# Patient Record
Sex: Male | Born: 1940
Health system: Southern US, Community
[De-identification: ages and names within clinical notes are randomized; demographics above are authoritative.]

## PROBLEM LIST (undated history)

## (undated) DIAGNOSIS — J9500 Unspecified tracheostomy complication: Secondary | ICD-10-CM

## (undated) DIAGNOSIS — I639 Cerebral infarction, unspecified: Secondary | ICD-10-CM

## (undated) DIAGNOSIS — Z952 Presence of prosthetic heart valve: Secondary | ICD-10-CM

## (undated) DIAGNOSIS — I259 Chronic ischemic heart disease, unspecified: Secondary | ICD-10-CM

## (undated) DIAGNOSIS — R001 Bradycardia, unspecified: Secondary | ICD-10-CM

## (undated) DIAGNOSIS — I495 Sick sinus syndrome: Secondary | ICD-10-CM

## (undated) DIAGNOSIS — I442 Atrioventricular block, complete: Secondary | ICD-10-CM

## (undated) HISTORY — PX: AORTIC VALVE REPLACEMENT (AVR)/CORONARY ARTERY BYPASS GRAFTING (CABG): SHX5725

---

## 2017-09-13 ENCOUNTER — Inpatient Hospital Stay
Admission: EM | Admit: 2017-09-13 | Discharge: 2017-10-14 | Disposition: A | Discharge status: Skilled Nursing Facility | Payer: Self-pay | Attending: Internal Medicine | Admitting: Internal Medicine

## 2017-09-13 DIAGNOSIS — Z4659 Encounter for fitting and adjustment of other gastrointestinal appliance and device: Secondary | ICD-10-CM

## 2017-09-13 DIAGNOSIS — R001 Bradycardia, unspecified: Secondary | ICD-10-CM

## 2017-09-13 DIAGNOSIS — I442 Atrioventricular block, complete: Secondary | ICD-10-CM

## 2017-09-13 DIAGNOSIS — Z431 Encounter for attention to gastrostomy: Secondary | ICD-10-CM

## 2017-09-13 DIAGNOSIS — I639 Cerebral infarction, unspecified: Secondary | ICD-10-CM

## 2017-09-13 DIAGNOSIS — J9690 Respiratory failure, unspecified, unspecified whether with hypoxia or hypercapnia: Secondary | ICD-10-CM

## 2017-09-13 DIAGNOSIS — I495 Sick sinus syndrome: Secondary | ICD-10-CM

## 2017-09-13 DIAGNOSIS — Z931 Gastrostomy status: Secondary | ICD-10-CM

## 2017-09-13 DIAGNOSIS — J9500 Unspecified tracheostomy complication: Secondary | ICD-10-CM

## 2017-09-13 DIAGNOSIS — I259 Chronic ischemic heart disease, unspecified: Secondary | ICD-10-CM

## 2017-09-13 DIAGNOSIS — Z952 Presence of prosthetic heart valve: Secondary | ICD-10-CM

## 2017-09-13 DIAGNOSIS — D499 Neoplasm of unspecified behavior of unspecified site: Secondary | ICD-10-CM

## 2017-09-13 HISTORY — DX: Sick sinus syndrome: I49.5

## 2017-09-13 HISTORY — DX: Atrioventricular block, complete: I44.2

## 2017-09-13 HISTORY — DX: Cerebral infarction, unspecified: I63.9

## 2017-09-13 HISTORY — DX: Unspecified tracheostomy complication: J95.00

## 2017-09-13 HISTORY — DX: Bradycardia, unspecified: R00.1

## 2017-09-13 HISTORY — DX: Presence of prosthetic heart valve: Z95.2

## 2017-09-13 HISTORY — DX: Chronic ischemic heart disease, unspecified: I25.9

## 2017-09-13 LAB — BLOOD GAS, ARTERIAL
ACID-BASE EXCESS: 7.2 mmol/L — AB (ref 0.0–2.0)
Bicarbonate: 30.8 mmol/L — ABNORMAL HIGH (ref 20.0–28.0)
FIO2: 40
MECHVT: 390 mL
O2 Saturation: 97.7 %
PEEP/CPAP: 5 cmH2O
Patient temperature: 97.6
RATE: 12 resp/min
pCO2 arterial: 39.3 mmHg (ref 32.0–48.0)
pH, Arterial: 7.503 — ABNORMAL HIGH (ref 7.350–7.450)
pO2, Arterial: 92.9 mmHg (ref 83.0–108.0)

## 2017-09-14 ENCOUNTER — Other Ambulatory Visit (HOSPITAL_COMMUNITY): Payer: Self-pay

## 2017-09-14 LAB — CBC WITH DIFFERENTIAL/PLATELET
BASOS ABS: 0 10*3/uL (ref 0.0–0.1)
BASOS PCT: 0 %
Eosinophils Absolute: 0.1 10*3/uL (ref 0.0–0.7)
Eosinophils Relative: 1 %
HEMATOCRIT: 26.7 % — AB (ref 39.0–52.0)
HEMOGLOBIN: 8.5 g/dL — AB (ref 13.0–17.0)
LYMPHS PCT: 10 %
Lymphs Abs: 1 10*3/uL (ref 0.7–4.0)
MCH: 30.9 pg (ref 26.0–34.0)
MCHC: 31.8 g/dL (ref 30.0–36.0)
MCV: 97.1 fL (ref 78.0–100.0)
Monocytes Absolute: 0.6 10*3/uL (ref 0.1–1.0)
Monocytes Relative: 6 %
NEUTROS ABS: 7.8 10*3/uL — AB (ref 1.7–7.7)
NEUTROS PCT: 83 %
Platelets: 328 10*3/uL (ref 150–400)
RBC: 2.75 MIL/uL — AB (ref 4.22–5.81)
RDW: 14.4 % (ref 11.5–15.5)
WBC: 9.5 10*3/uL (ref 4.0–10.5)

## 2017-09-14 LAB — COMPREHENSIVE METABOLIC PANEL
ALBUMIN: 1.8 g/dL — AB (ref 3.5–5.0)
ALT: 38 U/L (ref 17–63)
ANION GAP: 12 (ref 5–15)
AST: 45 U/L — ABNORMAL HIGH (ref 15–41)
Alkaline Phosphatase: 98 U/L (ref 38–126)
BILIRUBIN TOTAL: 0.4 mg/dL (ref 0.3–1.2)
BUN: 28 mg/dL — ABNORMAL HIGH (ref 6–20)
CO2: 25 mmol/L (ref 22–32)
Calcium: 7.4 mg/dL — ABNORMAL LOW (ref 8.9–10.3)
Chloride: 98 mmol/L — ABNORMAL LOW (ref 101–111)
Creatinine, Ser: 0.97 mg/dL (ref 0.61–1.24)
GLUCOSE: 178 mg/dL — AB (ref 65–99)
POTASSIUM: 3.7 mmol/L (ref 3.5–5.1)
Sodium: 135 mmol/L (ref 135–145)
TOTAL PROTEIN: 5.4 g/dL — AB (ref 6.5–8.1)

## 2017-09-14 LAB — PROTIME-INR
INR: 1.04
PROTHROMBIN TIME: 13.5 s (ref 11.4–15.2)

## 2017-09-14 LAB — PHOSPHORUS: PHOSPHORUS: 3.5 mg/dL (ref 2.5–4.6)

## 2017-09-14 LAB — APTT: APTT: 38 s — AB (ref 24–36)

## 2017-09-14 LAB — MAGNESIUM: Magnesium: 2.8 mg/dL — ABNORMAL HIGH (ref 1.7–2.4)

## 2017-09-15 ENCOUNTER — Other Ambulatory Visit (HOSPITAL_COMMUNITY): Payer: Self-pay

## 2017-09-16 LAB — MAGNESIUM: Magnesium: 2.4 mg/dL (ref 1.7–2.4)

## 2017-09-16 LAB — C DIFFICILE QUICK SCREEN W PCR REFLEX
C DIFFICLE (CDIFF) ANTIGEN: NEGATIVE
C Diff interpretation: NOT DETECTED
C Diff toxin: NEGATIVE

## 2017-09-16 LAB — BASIC METABOLIC PANEL
Anion gap: 7 (ref 5–15)
BUN: 20 mg/dL (ref 6–20)
CALCIUM: 7.7 mg/dL — AB (ref 8.9–10.3)
CHLORIDE: 106 mmol/L (ref 101–111)
CO2: 28 mmol/L (ref 22–32)
CREATININE: 0.85 mg/dL (ref 0.61–1.24)
GFR calc non Af Amer: >60 mL/min (ref >60)
GLUCOSE: 133 mg/dL — AB (ref 65–99)
Potassium: 3.4 mmol/L — ABNORMAL LOW (ref 3.5–5.1)
Sodium: 141 mmol/L (ref 135–145)

## 2017-09-16 LAB — PROTIME-INR
INR: 1.06
PROTHROMBIN TIME: 13.8 s (ref 11.4–15.2)

## 2017-09-16 LAB — VANCOMYCIN, TROUGH: Vancomycin Tr: 8 ug/mL — ABNORMAL LOW (ref 15–20)

## 2017-09-17 LAB — PROTIME-INR
INR: 1.13
Prothrombin Time: 14.4 seconds (ref 11.4–15.2)

## 2017-09-17 NOTE — Consult Note (Signed)
Chief Complaint: Patient was seen in consultation today for percutaneous gastric tube placement at the request of Dr Laren Everts  Supervising Physician: Markus Daft  Patient Status: [facility]  History of Present Illness: Antonio Frost is a 76 y.o. male   Hx CVA DM; CHF; CAD Hospitalized with Pneumonia Become hypoxic- suffered cardiac arrest Trach 11/15 Developed Upper extr DVT-- now on coumadin and bridging Lovenox INR 1.13 today Dysphagia Protein calorie malnutrition Need for long term care  Request for Percutaneous gastric tube placement Dr Barbie Banner has reviewed imaging and approves procedure On Plavix actively Must be off 5 days to proceed safely   No past medical history on file.   Allergies: Patient has no allergy information on record.  Medications: Prior to Admission medications   Not on File     No family history on file.  Social History   Socioeconomic History  . Marital status: Not on file    Spouse name: Not on file  . Number of children: Not on file  . Years of education: Not on file  . Highest education level: Not on file  Social Needs  . Financial resource strain: Not on file  . Food insecurity - worry: Not on file  . Food insecurity - inability: Not on file  . Transportation needs - medical: Not on file  . Transportation needs - non-medical: Not on file  Occupational History  . Not on file  Tobacco Use  . Smoking status: Not on file  Substance and Sexual Activity  . Alcohol use: Not on file  . Drug use: Not on file  . Sexual activity: Not on file  Other Topics Concern  . Not on file  Social History Narrative  . Not on file    Review of Systems: A 12 point ROS discussed and pertinent positives are indicated in the HPI above.  All other systems are negative.  Review of Systems  Constitutional: Positive for activity change, appetite change and fatigue. Negative for fever.  Respiratory:       Trach    Vital Signs: There were no  vitals taken for this visit.  Physical Exam  Cardiovascular: Normal rate and regular rhythm.  Pulmonary/Chest: Breath sounds normal. He has no wheezes.  Abdominal: Soft. Bowel sounds are normal.  Skin: Skin is warm and dry.  Psychiatric:  Wife at bedside Discussed G tube placement with her Await decision on Plavix and holding for 5 days  Nursing note and vitals reviewed.   Imaging: Ct Abdomen Wo Contrast  Result Date: 09/16/2017 CLINICAL DATA:  G tube evaluation.  Dysphagia. EXAM: CT ABDOMEN WITHOUT CONTRAST TECHNIQUE: Multidetector CT imaging of the abdomen was performed following the standard protocol without IV contrast. COMPARISON:  None. FINDINGS: Lower chest: There is consolidation in both medial posterior lower lobes right greater than left. Peripheral calcific densities at the aortic valve. This may be related to postoperative change. Coronary artery calcifications are present. Hepatobiliary: Unremarkable Pancreas: Unremarkable Spleen: Unremarkable Adrenals/Urinary Tract: Tiny calculus in the anterior upper pole of the right kidney. Mild cortical thinning bilaterally. Adrenal glands are normal. Stomach/Bowel: There are dilated small bowel loops with air-fluid levels. The lower abdomen and pelvis were not imaged on this study. A definitive transition zone cannot be identified. The stomach is positioned between the transverse colon and left lobe of the liver. A feeding tube is in place. The tip is just beyond the duodenal bulb and in the first portion of the duodenum. Vascular/Lymphatic: No abnormal retroperitoneal  adenopathy. Advanced atherosclerotic calcification of the aorta and iliac arteries. Other: No free-fluid. Musculoskeletal: No vertebral compression. IMPRESSION: There is favorable anatomy for gastrostomy tube placement. Bilateral lower lobe consolidation right greater than left. Right nephrolithiasis. Dilated small bowel loops with air-fluid levels are nonspecific. This probably  represents ileus. Developing small bowel obstruction is not excluded. Electronically Signed   By: Marybelle Killings M.D.   On: 09/16/2017 08:33   Dg Chest Port 1 View  Result Date: 09/14/2017 CLINICAL DATA:  Shortness of breath. EXAM: PORTABLE CHEST 1 VIEW COMPARISON:  None. FINDINGS: A feeding tube at least reaches the stomach. Tracheostomy in place. Streaky opacities over the diaphragm. No Kerley lines, air bronchogram, effusion, or pneumothorax. Normal heart size. Status post CABG. IMPRESSION: Mild atelectasis at the bases. Electronically Signed   By: Monte Fantasia M.D.   On: 09/14/2017 09:12   Dg Abd Portable 1v  Result Date: 09/14/2017 CLINICAL DATA:  76 year old male status post NG tube placement. EXAM: PORTABLE ABDOMEN - 1 VIEW COMPARISON:  None. FINDINGS: An enteric tube is partially visualized extending into the left upper abdomen with tip to the right of the L1-L2 disc space likely in the distal stomach. Air is noted within the colon. No small bowel dilatation. Small amount of air under the left hemidiaphragm may be within the stomach. Pneumoperitoneum is not entirely excluded. Correlation with history of recent surgery recommended. Median sternotomy wires noted. There is degenerative changes of the spine. No acute osseous pathology. IMPRESSION: Enteric tube with tip in the distal stomach. Electronically Signed   By: Anner Crete M.D.   On: 09/14/2017 00:48    Labs:  CBC: Recent Labs    09/14/17 0700  WBC 9.5  HGB 8.5*  HCT 26.7*  PLT 328    COAGS: Recent Labs    09/14/17 0700 09/16/17 0412 09/17/17 0528  INR 1.04 1.06 1.13  APTT 38*  --   --     BMP: Recent Labs    09/14/17 0700 09/16/17 1118  NA 135 141  K 3.7 3.4*  CL 98* 106  CO2 25 28  GLUCOSE 178* 133*  BUN 28* 20  CALCIUM 7.4* 7.7*  CREATININE 0.97 0.85  GFRNONAA >60 >60  GFRAA >60 >60    LIVER FUNCTION TESTS: Recent Labs    09/14/17 0700  BILITOT 0.4  AST 45*  ALT 38  ALKPHOS 98  PROT  5.4*  ALBUMIN 1.8*    TUMOR MARKERS: No results for input(s): AFPTM, CEA, CA199, CHROMGRNA in the last 8760 hours.  Assessment and Plan:  CVA Dysphagia Protein calorie malnutrition Need for long term care On Plavix daily (must be off 5 days to safely perform this procedure) Await decision from MD Pt also now off coumadin - INR 1.13  Discussed with wife procedure benefits and risks including but not limited to; Infection; bleeding; organ damage; damage to surrounding structures She will wait to discuss with family. Await decision on Plavix hold.   Thank you for this interesting consult.  I greatly enjoyed meeting Shonte B Ivanov and look forward to participating in their care.  A copy of this report was sent to the requesting provider on this date.  Electronically Signed: Lavonia Drafts, PA-C 09/17/2017, 10:30 AM   I spent a total of 40 Minutes    in face to face in clinical consultation, greater than 50% of which was counseling/coordinating care for perc G tube placement

## 2017-09-18 LAB — PROTIME-INR
INR: 1.14
Prothrombin Time: 14.5 seconds (ref 11.4–15.2)

## 2017-09-19 LAB — PROTIME-INR
INR: 1.13
Prothrombin Time: 14.4 seconds (ref 11.4–15.2)

## 2017-09-20 LAB — CBC
HCT: 27.9 % — ABNORMAL LOW (ref 39.0–52.0)
HEMOGLOBIN: 8.7 g/dL — AB (ref 13.0–17.0)
MCH: 30.7 pg (ref 26.0–34.0)
MCHC: 31.2 g/dL (ref 30.0–36.0)
MCV: 98.6 fL (ref 78.0–100.0)
Platelets: 268 10*3/uL (ref 150–400)
RBC: 2.83 MIL/uL — ABNORMAL LOW (ref 4.22–5.81)
RDW: 15.1 % (ref 11.5–15.5)
WBC: 5.2 10*3/uL (ref 4.0–10.5)

## 2017-09-20 LAB — PROTIME-INR
INR: 1.25
PROTHROMBIN TIME: 15.6 s — AB (ref 11.4–15.2)

## 2017-09-21 ENCOUNTER — Other Ambulatory Visit (HOSPITAL_COMMUNITY): Payer: Self-pay

## 2017-09-21 LAB — PROTIME-INR
INR: 1.33
PROTHROMBIN TIME: 16.3 s — AB (ref 11.4–15.2)

## 2017-09-21 NOTE — Progress Notes (Signed)
Patient has been bridging with lovenox.  Appears coumadin has been administered for the past 2 days. INR remains <1.5. Will recheck INR tomorrow.  As long as remains <1.5 will work towards procedure as scheduled.   Hold lovenox and coumadin. Orders given to hold blood thinners and TFs.   Brynda Greathouse, MS RD PA-C 12:27 PM

## 2017-09-22 ENCOUNTER — Encounter (HOSPITAL_COMMUNITY): Payer: Self-pay | Admitting: Interventional Radiology

## 2017-09-22 ENCOUNTER — Other Ambulatory Visit (HOSPITAL_COMMUNITY): Payer: Self-pay

## 2017-09-22 HISTORY — PX: IR GASTROSTOMY TUBE MOD SED: IMG625

## 2017-09-22 LAB — PROTIME-INR
INR: 1.28
PROTHROMBIN TIME: 15.9 s — AB (ref 11.4–15.2)

## 2017-09-22 MED ORDER — MIDAZOLAM HCL 2 MG/2ML IJ SOLN
INTRAMUSCULAR | Status: AC | PRN
  Administered 2017-09-22: 1 mg via INTRAVENOUS
  Administered 2017-09-22: 0.5 mg via INTRAVENOUS

## 2017-09-22 MED ORDER — LIDOCAINE-EPINEPHRINE (PF) 1 %-1:200000 IJ SOLN
30.0000 mL | INTRAMUSCULAR | Status: AC
  Filled 2017-09-22: qty 30

## 2017-09-22 MED ORDER — MIDAZOLAM HCL 2 MG/2ML IJ SOLN
INTRAMUSCULAR | Status: AC
  Filled 2017-09-22: qty 2

## 2017-09-22 MED ORDER — LIDOCAINE HCL 1 % IJ SOLN
INTRAMUSCULAR | Status: AC
  Filled 2017-09-22: qty 20

## 2017-09-22 MED ORDER — CEFAZOLIN SODIUM-DEXTROSE 2-4 GM/100ML-% IV SOLN
INTRAVENOUS | Status: AC
  Filled 2017-09-22: qty 100

## 2017-09-22 MED ORDER — GLUCAGON HCL RDNA (DIAGNOSTIC) 1 MG IJ SOLR
INTRAMUSCULAR | Status: AC | PRN
  Administered 2017-09-22: 1 mg via INTRAVENOUS

## 2017-09-22 MED ORDER — FENTANYL CITRATE (PF) 100 MCG/2ML IJ SOLN
INTRAMUSCULAR | Status: AC | PRN
  Administered 2017-09-22: 25 ug via INTRAVENOUS
  Administered 2017-09-22: 50 ug via INTRAVENOUS

## 2017-09-22 MED ORDER — FENTANYL CITRATE (PF) 100 MCG/2ML IJ SOLN
INTRAMUSCULAR | Status: AC
  Filled 2017-09-22: qty 2

## 2017-09-22 MED ORDER — LIDOCAINE-EPINEPHRINE (PF) 2 %-1:200000 IJ SOLN
INTRAMUSCULAR | Status: AC | PRN
  Administered 2017-09-22: 10 mL

## 2017-09-22 MED ORDER — CEFAZOLIN SODIUM-DEXTROSE 2-4 GM/100ML-% IV SOLN
2.0000 g | Freq: Once | INTRAVENOUS | Status: AC
  Administered 2017-09-22: 2 g via INTRAVENOUS

## 2017-09-22 MED ORDER — GLUCAGON HCL RDNA (DIAGNOSTIC) 1 MG IJ SOLR
INTRAMUSCULAR | Status: AC
  Filled 2017-09-22: qty 1

## 2017-09-22 MED ORDER — IOPAMIDOL (ISOVUE-300) INJECTION 61%
INTRAVENOUS | Status: AC
  Administered 2017-09-22: 20 mL
  Filled 2017-09-22: qty 50

## 2017-09-22 NOTE — Procedures (Signed)
Pre procedure Dx: Dysphagia Post Procedure Dx: Same  Successful fluoroscopic guided insertion of gastrostomy tube.   The gastrostomy tube may be used immediately for medications.   Tube feeds may be initiated in 24 hours as per the primary team.    EBL: Minimal  Complications: None immediate  Jay Jonel Sick, MD Pager #: [phone]    

## 2017-09-24 LAB — PROTIME-INR
INR: 1.34
PROTHROMBIN TIME: 16.4 s — AB (ref 11.4–15.2)

## 2017-09-25 LAB — CBC
HEMATOCRIT: 25.8 % — AB (ref 39.0–52.0)
Hemoglobin: 8.5 g/dL — ABNORMAL LOW (ref 13.0–17.0)
MCH: 31.5 pg (ref 26.0–34.0)
MCHC: 32.9 g/dL (ref 30.0–36.0)
MCV: 95.6 fL (ref 78.0–100.0)
PLATELETS: 119 10*3/uL — AB (ref 150–400)
RBC: 2.7 MIL/uL — AB (ref 4.22–5.81)
RDW: 16 % — AB (ref 11.5–15.5)
WBC: 4 10*3/uL (ref 4.0–10.5)

## 2017-09-25 LAB — BASIC METABOLIC PANEL
Anion gap: 8 (ref 5–15)
BUN: 15 mg/dL (ref 6–20)
CHLORIDE: 102 mmol/L (ref 101–111)
CO2: 24 mmol/L (ref 22–32)
Calcium: 8.3 mg/dL — ABNORMAL LOW (ref 8.9–10.3)
Creatinine, Ser: 0.71 mg/dL (ref 0.61–1.24)
Glucose, Bld: 104 mg/dL — ABNORMAL HIGH (ref 65–99)
POTASSIUM: 5.5 mmol/L — AB (ref 3.5–5.1)
SODIUM: 134 mmol/L — AB (ref 135–145)

## 2017-09-26 LAB — POTASSIUM: POTASSIUM: 4.9 mmol/L (ref 3.5–5.1)

## 2017-09-27 LAB — BASIC METABOLIC PANEL
ANION GAP: 8 (ref 5–15)
BUN: 14 mg/dL (ref 6–20)
CALCIUM: 8.2 mg/dL — AB (ref 8.9–10.3)
CO2: 26 mmol/L (ref 22–32)
Chloride: 103 mmol/L (ref 101–111)
Creatinine, Ser: 0.71 mg/dL (ref 0.61–1.24)
Glucose, Bld: 132 mg/dL — ABNORMAL HIGH (ref 65–99)
POTASSIUM: 3.6 mmol/L (ref 3.5–5.1)
Sodium: 137 mmol/L (ref 135–145)

## 2017-09-29 ENCOUNTER — Encounter: Payer: Self-pay | Admitting: Internal Medicine

## 2017-09-29 DIAGNOSIS — I495 Sick sinus syndrome: Secondary | ICD-10-CM

## 2017-09-29 DIAGNOSIS — J9509 Other tracheostomy complication: Secondary | ICD-10-CM

## 2017-09-29 DIAGNOSIS — Z952 Presence of prosthetic heart valve: Secondary | ICD-10-CM

## 2017-09-29 DIAGNOSIS — I442 Atrioventricular block, complete: Secondary | ICD-10-CM

## 2017-09-29 DIAGNOSIS — I639 Cerebral infarction, unspecified: Secondary | ICD-10-CM

## 2017-09-29 DIAGNOSIS — J9500 Unspecified tracheostomy complication: Secondary | ICD-10-CM

## 2017-09-29 DIAGNOSIS — I259 Chronic ischemic heart disease, unspecified: Secondary | ICD-10-CM

## 2017-09-29 DIAGNOSIS — I499 Cardiac arrhythmia, unspecified: Secondary | ICD-10-CM

## 2017-09-29 DIAGNOSIS — R001 Bradycardia, unspecified: Secondary | ICD-10-CM

## 2017-09-29 HISTORY — DX: Chronic ischemic heart disease, unspecified: I25.9

## 2017-09-29 HISTORY — DX: Sick sinus syndrome: I49.5

## 2017-09-29 HISTORY — DX: Cerebral infarction, unspecified: I63.9

## 2017-09-29 HISTORY — DX: Unspecified tracheostomy complication: J95.00

## 2017-09-29 HISTORY — DX: Bradycardia, unspecified: R00.1

## 2017-09-29 HISTORY — DX: Presence of prosthetic heart valve: Z95.2

## 2017-09-29 HISTORY — DX: Atrioventricular block, complete: I44.2

## 2017-09-29 LAB — CBC
HEMATOCRIT: 33.9 % — AB (ref 39.0–52.0)
HEMOGLOBIN: 10.6 g/dL — AB (ref 13.0–17.0)
MCH: 31.1 pg (ref 26.0–34.0)
MCHC: 31.3 g/dL (ref 30.0–36.0)
MCV: 99.4 fL (ref 78.0–100.0)
Platelets: 288 10*3/uL (ref 150–400)
RBC: 3.41 MIL/uL — ABNORMAL LOW (ref 4.22–5.81)
RDW: 16.6 % — AB (ref 11.5–15.5)
WBC: 4.6 10*3/uL (ref 4.0–10.5)

## 2017-09-29 LAB — BASIC METABOLIC PANEL
ANION GAP: 8 (ref 5–15)
BUN: 12 mg/dL (ref 6–20)
CALCIUM: 8.6 mg/dL — AB (ref 8.9–10.3)
CO2: 28 mmol/L (ref 22–32)
Chloride: 102 mmol/L (ref 101–111)
Creatinine, Ser: 0.72 mg/dL (ref 0.61–1.24)
GFR calc Af Amer: >60 mL/min (ref >60)
GLUCOSE: 108 mg/dL — AB (ref 65–99)
POTASSIUM: 4.5 mmol/L (ref 3.5–5.1)
SODIUM: 138 mmol/L (ref 135–145)

## 2017-09-29 LAB — TSH: TSH: 2.972 u[IU]/mL (ref 0.350–4.500)

## 2017-09-29 LAB — MAGNESIUM: MAGNESIUM: 1.9 mg/dL (ref 1.7–2.4)

## 2017-09-29 NOTE — Consult Note (Signed)
Cardiology Consultation:   Patient ID: Antonio Frost; [id number]; [birth year]   Admit date: 09/13/2017 Date of Consult: 09/29/2017  Primary Care Provider: No primary care provider on file. Primary Cardiologist: No primary care provider on file.  Dr. Metta Clines [facility] Primary Electrophysiologist: None   Patient Profile:   Antonio Frost is a 76 y.o. male with a hx of CAD (remote MI/PCI), DM, CHF, HTN, hypothyroidism, VHD with bioprosthetic AVR, and VDRF with trach now who is being seen today for the evaluation of bradycardic episodes at the request of Dr. Owens Shark.  History of Present Illness:   Mr. Narine was initially admitted to [facility] 08/23/17 with respiratory failure, CAP, became hypoxic and intubated, self-extubated but required re-intubation, eventually extubated though a few days later became hypoxic again had arrest requiring CPR and was re-intubated ultimately required intubation, and unable to wean requiring trach.  During his stay he had seizure and found with new CVA as well.  Recurrent fever with VAP, UTI, was also found with b/l UE DVT, on coumadin w/lovenox until therapeutic.  [facility] and pulmonary care  He has no  antecedent history of syncope  Meds reviewed, no rate limiting medicines   Past Medical History:  Diagnosis Date  . Complete heart block ([place]) 09/29/2017  . H/O aortic valve replacement 09/29/2017  . Ischemic heart disease 09/29/2017  . Sinus node dysfunction ([place]) 09/29/2017  . Stroke ([place]) 09/29/2017  . Tracheostomy complication ([place]) 41/06/3789  . Vagal autonomic bradycardia 09/29/2017        Inpatient Medications: Scheduled Meds: reviewed but not available for import   Continuous Infusions:  PRN Meds:   Allergies:   Allergies not on file  Social History:   Social History   Socioeconomic History  . Marital status: Not on file    Spouse name: Not on file  . Number of children: Not on file    . Years of education: Not on file  . Highest education level: Not on file  Social Needs  . Financial resource strain: Not on file  . Food insecurity - worry: Not on file  . Food insecurity - inability: Not on file  . Transportation needs - medical: Not on file  . Transportation needs - non-medical: Not on file  Occupational History  . Not on file  Tobacco Use  . Smoking status: Not on file  Substance and Sexual Activity  . Alcohol use: Not on file  . Drug use: Not on file  . Sexual activity: Not on file  Other Topics Concern  . Not on file  Social History Narrative  . Not on file    Family History:   Fam hx neg for conduction system disease  ROS:  Please see the history of present illness.  ROS  All other ROS reviewed and negative.     Physical Exam/Data:   Vitals:   09/22/17 1429 09/22/17 1441 09/22/17 1446 09/22/17 1450  BP: (!) 172/67 (!) 160/64 (!) 185/70 (!) 160/70  Pulse: 61 62 65 70  Resp: (!) 24 16 15    SpO2: 100% 99% 97% 100%     Well developed and nourished with trach in place and nonverbal HENT normal Neck supple   Carotids brisk and full without bruits Clear anteriorly  regular rate and rhythm, no murmurs or gallops Abd-soft with active BS without hepatomegaly No Clubbing cyanosis bilateral.  Bandages  and boots of his lower extremities Skin-warm and dry A  Grossly normal sensory and motor function agitated activity  EKG:  The EKG was personally reviewed and demonstrates:   No available EKG Telemetry:  Telemetry was personally reviewed and demonstrates:   Episodic bradycardia with the most profound episode in 0200 hrs. demonstrating simultaneous PP slowing  with the development of AV block  Relevant CV Studies:  [facility] record reports an echo done 04/25/15 LVEF 50-60%  Laboratory Data:  Chemistry Recent Labs  Lab 09/25/17 0546 09/26/17 1441 09/27/17 0701 09/29/17 0954  NA 134*  --  137 138  K 5.5* 4.9 3.6 4.5  CL 102  --  103 102  CO2  24  --  26 28  GLUCOSE 104*  --  132* 108*  BUN 15  --  14 12  CREATININE 0.71  --  0.71 0.72  CALCIUM 8.3*  --  8.2* 8.6*  GFRNONAA >60  --  >60 >60  GFRAA >60  --  >60 >60  ANIONGAP 8  --  8 8    No results for input(s): PROT, ALBUMIN, AST, ALT, ALKPHOS, BILITOT in the last 168 hours. Hematology Recent Labs  Lab 09/25/17 0546 09/29/17 0954  WBC 4.0 4.6  RBC 2.70* 3.41*  HGB 8.5* 10.6*  HCT 25.8* 33.9*  MCV 95.6 99.4  MCH 31.5 31.1  MCHC 32.9 31.3  RDW 16.0* 16.6*  PLT 119* 288   Cardiac EnzymesNo results for input(s): TROPONINI in the last 168 hours. No results for input(s): TROPIPOC in the last 168 hours.  BNPNo results for input(s): BNP, PROBNP in the last 168 hours.  DDimer No results for input(s): DDIMER in the last 168 hours.  Radiology/Studies:  No results found.  Assessment and Plan:   Active Problems:   H/O aortic valve replacement   Ischemic heart disease   Stroke [facility])   Tracheostomy complication (HCC)   Complete heart block (HCC)   Sinus node dysfunction (HCC) DVT on anticoagulation Vagal mediated bradycardia    Patient's telemetry demonstrates simultaneous sinus node dysfunction and complete heart block.  This highlights the presence of an extrinsic cardiac cause affecting the sinus node and the AV node.  Almost certainly this is hyper vagotonia and in this circumstance I would expect that the likely trigger is either the tracheostomy or some kind of viscus distention related to constipation or urinary obstruction.  This does not require cardiac intervention.  Please call if we can be of further assistance     For questions or updates, please contact [facility] Please consult [email or web address] for contact info under Cardiology/STEMI.   Signed, Virl Axe, MD  09/29/2017 6:15 PM

## 2017-09-30 ENCOUNTER — Other Ambulatory Visit (HOSPITAL_COMMUNITY): Payer: Self-pay

## 2017-09-30 LAB — PROTIME-INR
INR: 1.04
Prothrombin Time: 13.5 seconds (ref 11.4–15.2)

## 2017-10-01 LAB — PROTIME-INR
INR: 1.1
Prothrombin Time: 14.1 seconds (ref 11.4–15.2)

## 2017-10-02 LAB — PROTIME-INR
INR: 1.1
PROTHROMBIN TIME: 14.1 s (ref 11.4–15.2)

## 2017-10-03 LAB — PROTIME-INR
INR: 1.11
Prothrombin Time: 14.2 seconds (ref 11.4–15.2)

## 2017-10-04 LAB — PROTIME-INR
INR: 1.13
PROTHROMBIN TIME: 14.4 s (ref 11.4–15.2)

## 2017-10-05 LAB — PROTIME-INR
INR: 1.29
Prothrombin Time: 16 seconds — ABNORMAL HIGH (ref 11.4–15.2)

## 2017-10-06 LAB — PROTIME-INR
INR: 1.28
PROTHROMBIN TIME: 15.8 s — AB (ref 11.4–15.2)

## 2017-10-07 LAB — PROTIME-INR
INR: 1.3
PROTHROMBIN TIME: 16.1 s — AB (ref 11.4–15.2)

## 2017-10-08 LAB — PROTIME-INR
INR: 1.44
Prothrombin Time: 17.4 seconds — ABNORMAL HIGH (ref 11.4–15.2)

## 2017-10-09 LAB — CBC
HEMATOCRIT: 37.1 % — AB (ref 39.0–52.0)
HEMOGLOBIN: 11.4 g/dL — AB (ref 13.0–17.0)
MCH: 31 pg (ref 26.0–34.0)
MCHC: 30.7 g/dL (ref 30.0–36.0)
MCV: 100.8 fL — AB (ref 78.0–100.0)
Platelets: 303 10*3/uL (ref 150–400)
RBC: 3.68 MIL/uL — AB (ref 4.22–5.81)
RDW: 16.7 % — ABNORMAL HIGH (ref 11.5–15.5)
WBC: 5.2 10*3/uL (ref 4.0–10.5)

## 2017-10-09 LAB — BASIC METABOLIC PANEL
ANION GAP: 11 (ref 5–15)
BUN: 19 mg/dL (ref 6–20)
CHLORIDE: 100 mmol/L — AB (ref 101–111)
CO2: 27 mmol/L (ref 22–32)
Calcium: 8.7 mg/dL — ABNORMAL LOW (ref 8.9–10.3)
Creatinine, Ser: 0.64 mg/dL (ref 0.61–1.24)
GFR calc Af Amer: >60 mL/min (ref >60)
GFR calc non Af Amer: >60 mL/min (ref >60)
GLUCOSE: 192 mg/dL — AB (ref 65–99)
POTASSIUM: 4.7 mmol/L (ref 3.5–5.1)
Sodium: 138 mmol/L (ref 135–145)

## 2017-10-09 LAB — PROTIME-INR
INR: 1.45
Prothrombin Time: 17.5 seconds — ABNORMAL HIGH (ref 11.4–15.2)

## 2017-10-10 LAB — PROTIME-INR
INR: 1.29
PROTHROMBIN TIME: 15.9 s — AB (ref 11.4–15.2)

## 2017-10-11 LAB — PROTIME-INR
INR: 1.41
PROTHROMBIN TIME: 17.2 s — AB (ref 11.4–15.2)

## 2017-10-12 LAB — PROTIME-INR
INR: 1.46
PROTHROMBIN TIME: 17.6 s — AB (ref 11.4–15.2)

## 2017-10-13 ENCOUNTER — Other Ambulatory Visit (HOSPITAL_COMMUNITY): Payer: Self-pay

## 2017-10-13 LAB — PROTIME-INR
INR: 1.76
Prothrombin Time: 20.4 seconds — ABNORMAL HIGH (ref 11.4–15.2)

## 2017-10-13 MED ORDER — IOPAMIDOL (ISOVUE-300) INJECTION 61%
50.0000 mL | Freq: Once | INTRAVENOUS | Status: AC | PRN
  Administered 2017-10-13: 50 mL

## 2017-10-13 MED ORDER — IOPAMIDOL (ISOVUE-300) INJECTION 61%
INTRAVENOUS | Status: AC
  Administered 2017-10-13: 50 mL
  Filled 2017-10-13: qty 50

## 2017-10-14 LAB — PROTIME-INR
INR: 1.94
PROTHROMBIN TIME: 22 s — AB (ref 11.4–15.2)

## 2017-10-26 DEATH — deceased

## 2019-10-17 IMAGING — XA IR PERC PLACEMENT GASTROSTOMY
7 series · 7 of 7 positions shown · non-contrast
Comparison: Abdominal CT - 09/15/2017

INDICATION: Dysphagia. Please perform percutaneous gastrostomy tube placement
for enteric nutrition supplementation.

EXAM:
PULL TROUGH GASTROSTOMY TUBE PLACEMENT

[Series 1: fl (-) angio · 1 of 1 slices shown (1 of 7)]
[im 1/1]
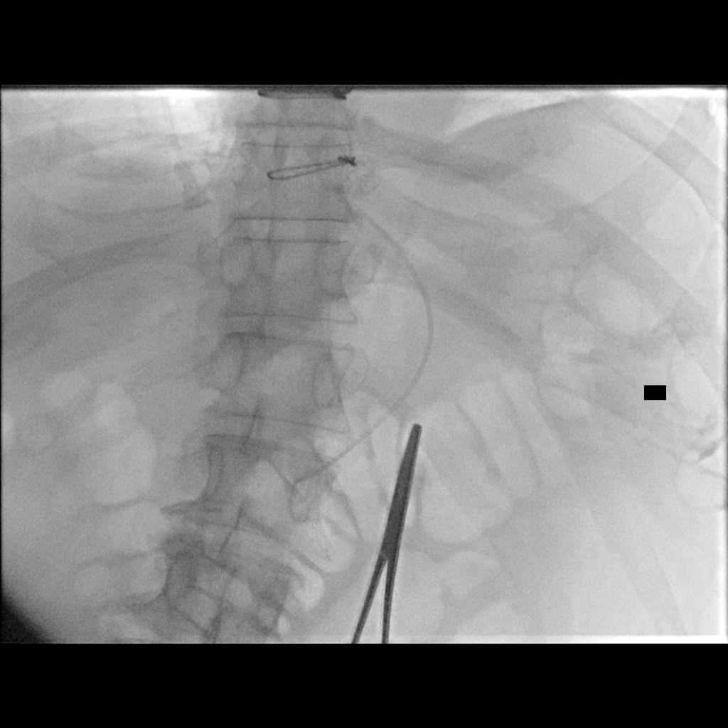

[Series 2: fl (-) angio · 1 of 1 slices shown (2 of 7)]
[im 1/1]
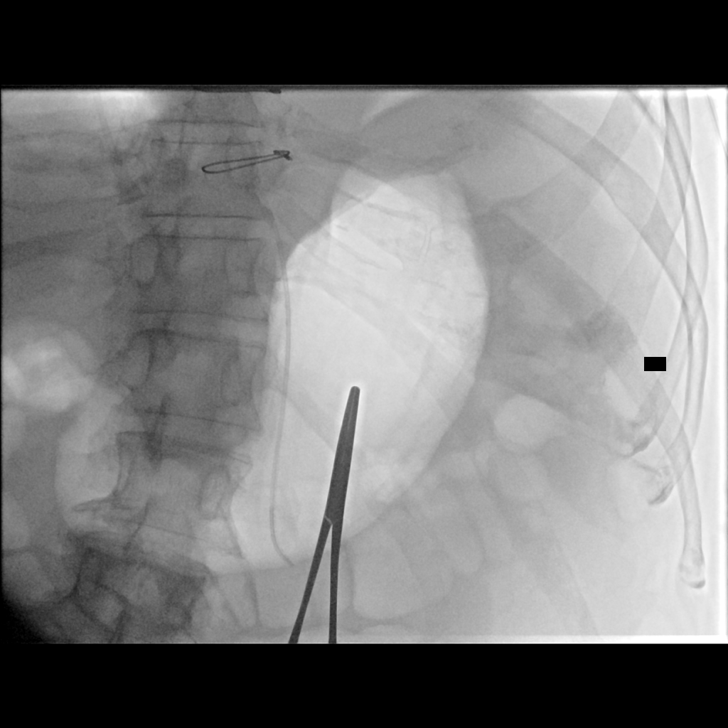

[Series 3: fl (-) angio · 1 of 1 slices shown (3 of 7)]
[im 1/1]
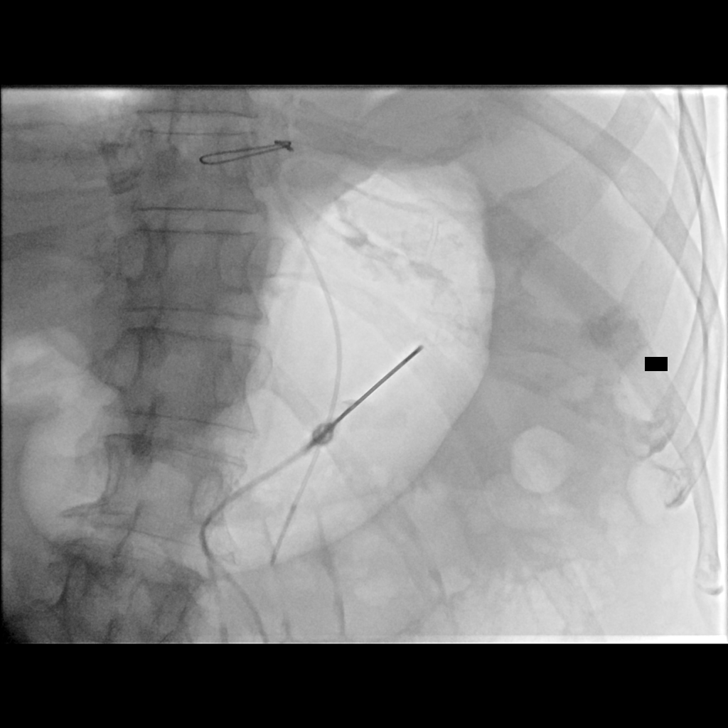

[Series 4: fl (-) angio · 1 of 1 slices shown (4 of 7)]
[im 1/1]
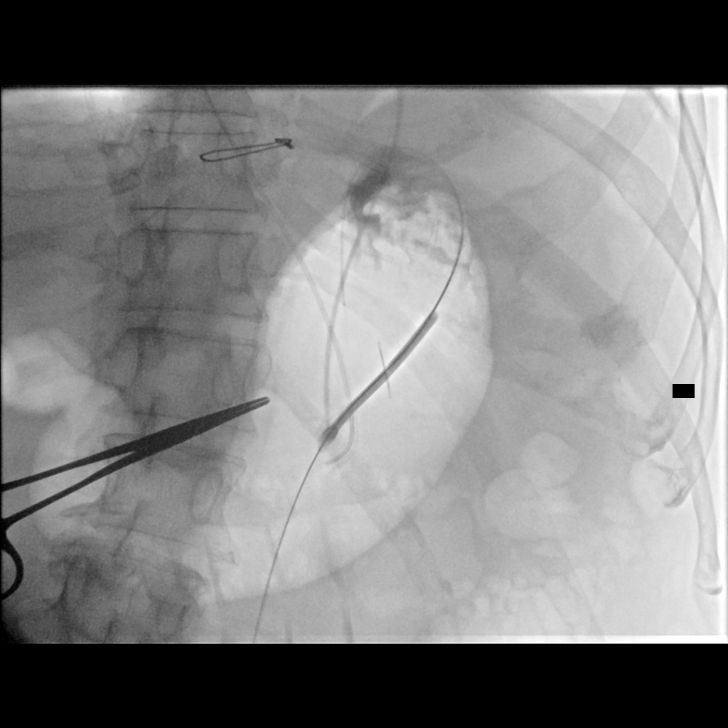

[Series 5: fl (-) angio · 1 of 1 slices shown (5 of 7)]
[im 1/1]
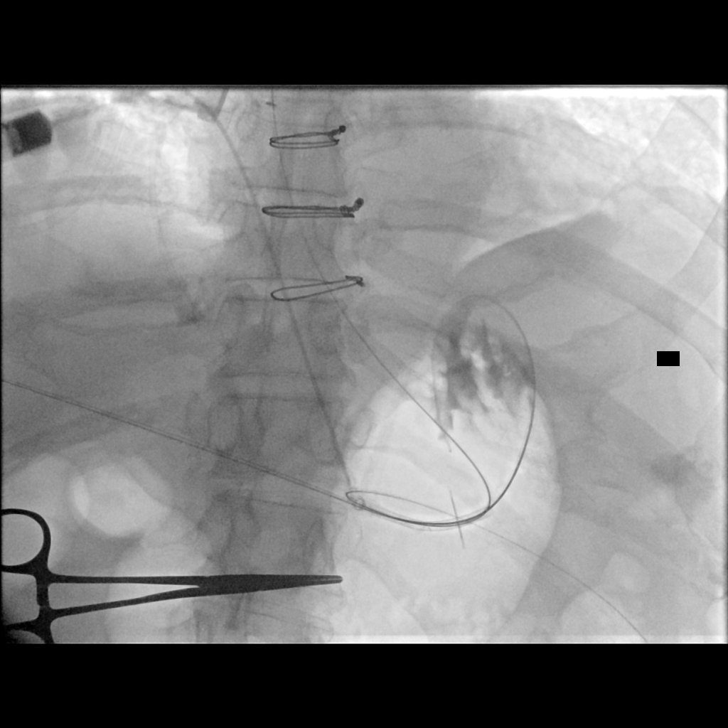

[Series 6: fl (-) angio · 1 of 1 slices shown (6 of 7)]
[im 1/1]
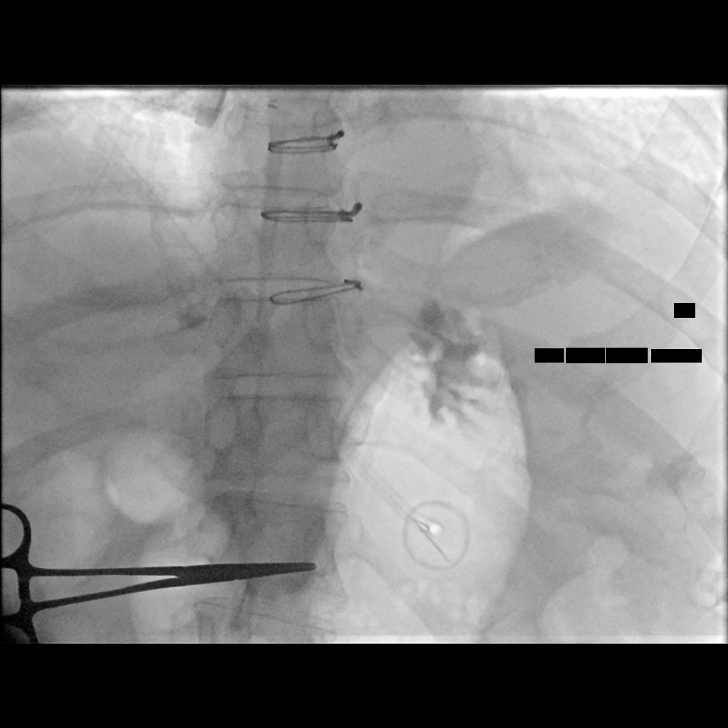

[Series 9: fl (-) angio · 1 of 1 slices shown (7 of 7)]
[im 1/1]
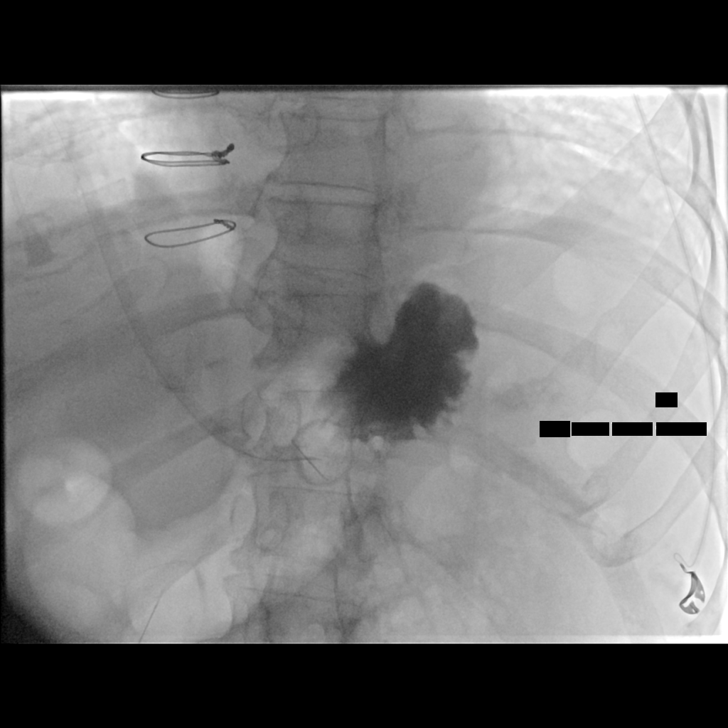

[7 of 7 positions shown; findings below may reference images not displayed]

MEDICATIONS:
Ancef 2 gm IV; Antibiotics were administered within 1 hour of the
procedure.

Glucagon 1 mg IV

CONTRAST:  20 mL of Isovue 300 administered into the gastric lumen.

ANESTHESIA/SEDATION:
Moderate (conscious) sedation was employed during this procedure. A
total of Versed 1.5 mg and Fentanyl 75 mcg was administered
intravenously.

Moderate Sedation Time: 10 minutes. The patient's level of
consciousness and vital signs were monitored continuously by
radiology nursing throughout the procedure under my direct
supervision.

FLUOROSCOPY TIME:  7 minutes 54 seconds (71 mGy)

COMPLICATIONS:
None immediate.

PROCEDURE:
Informed written consent was obtained from the patient's family
following explanation of the procedure, risks, benefits and
alternatives. A time out was performed prior to the initiation of
the procedure. Ultrasound scanning was performed to demarcate the
edge of the left lobe of the liver. Maximal barrier sterile
technique utilized including caps, mask, sterile gowns, sterile
gloves, large sterile drape, hand hygiene and Betadine prep.

The left upper quadrant was sterilely prepped and draped. An oral
gastric catheter was inserted into the stomach under fluoroscopy.
The existing nasogastric feeding tube was removed. The left costal
margin and air opacified transverse colon were identified and
avoided. Air was injected into the stomach for insufflation and
visualization under fluoroscopy. Under sterile conditions a 17 gauge
trocar needle was utilized to access the stomach percutaneously
beneath the left subcostal margin after the overlying soft tissues
were anesthetized with 1% Lidocaine with epinephrine. Needle
position was confirmed within the stomach with aspiration of air and
injection of small amount of contrast. A single T tack was deployed
for gastropexy. Over an Amplatz guide wire, a 9-French sheath was
inserted into the stomach. A snare device was utilized to capture
the oral gastric catheter. The snare device was pulled retrograde
from the stomach up the esophagus and out the oropharynx. The
20-French pull-through gastrostomy was connected to the snare device
and pulled antegrade through the oropharynx down the esophagus into
the stomach and then through the percutaneous tract external to the
patient. The gastrostomy was assembled externally. Contrast
injection confirms position in the stomach. Several spot
radiographic images were obtained in various obliquities for
documentation. The patient tolerated procedure well without
immediate post procedural complication.
FINDINGS: After successful fluoroscopic guided placement, the gastrostomy tube
is appropriately positioned with internal disc against the ventral
aspect of the gastric lumen.
IMPRESSION: Successful fluoroscopic insertion of a 20-French pull-through
gastrostomy tube.

The gastrostomy may be used immediately for medication
administration and in 24 hrs for the initiation of feeds.

## 2019-11-07 IMAGING — DX DG ABDOMEN 1V
1 series · 1 of 1 positions shown · non-contrast
Comparison: 09/22/2017 fluoroscopic images during gastrostomy tube
placement. 09/21/2017 abdominal radiograph.

CLINICAL DATA: Gastrostomy tube check.

EXAM:
ABDOMEN - 1 VIEW

[abdomen kub]
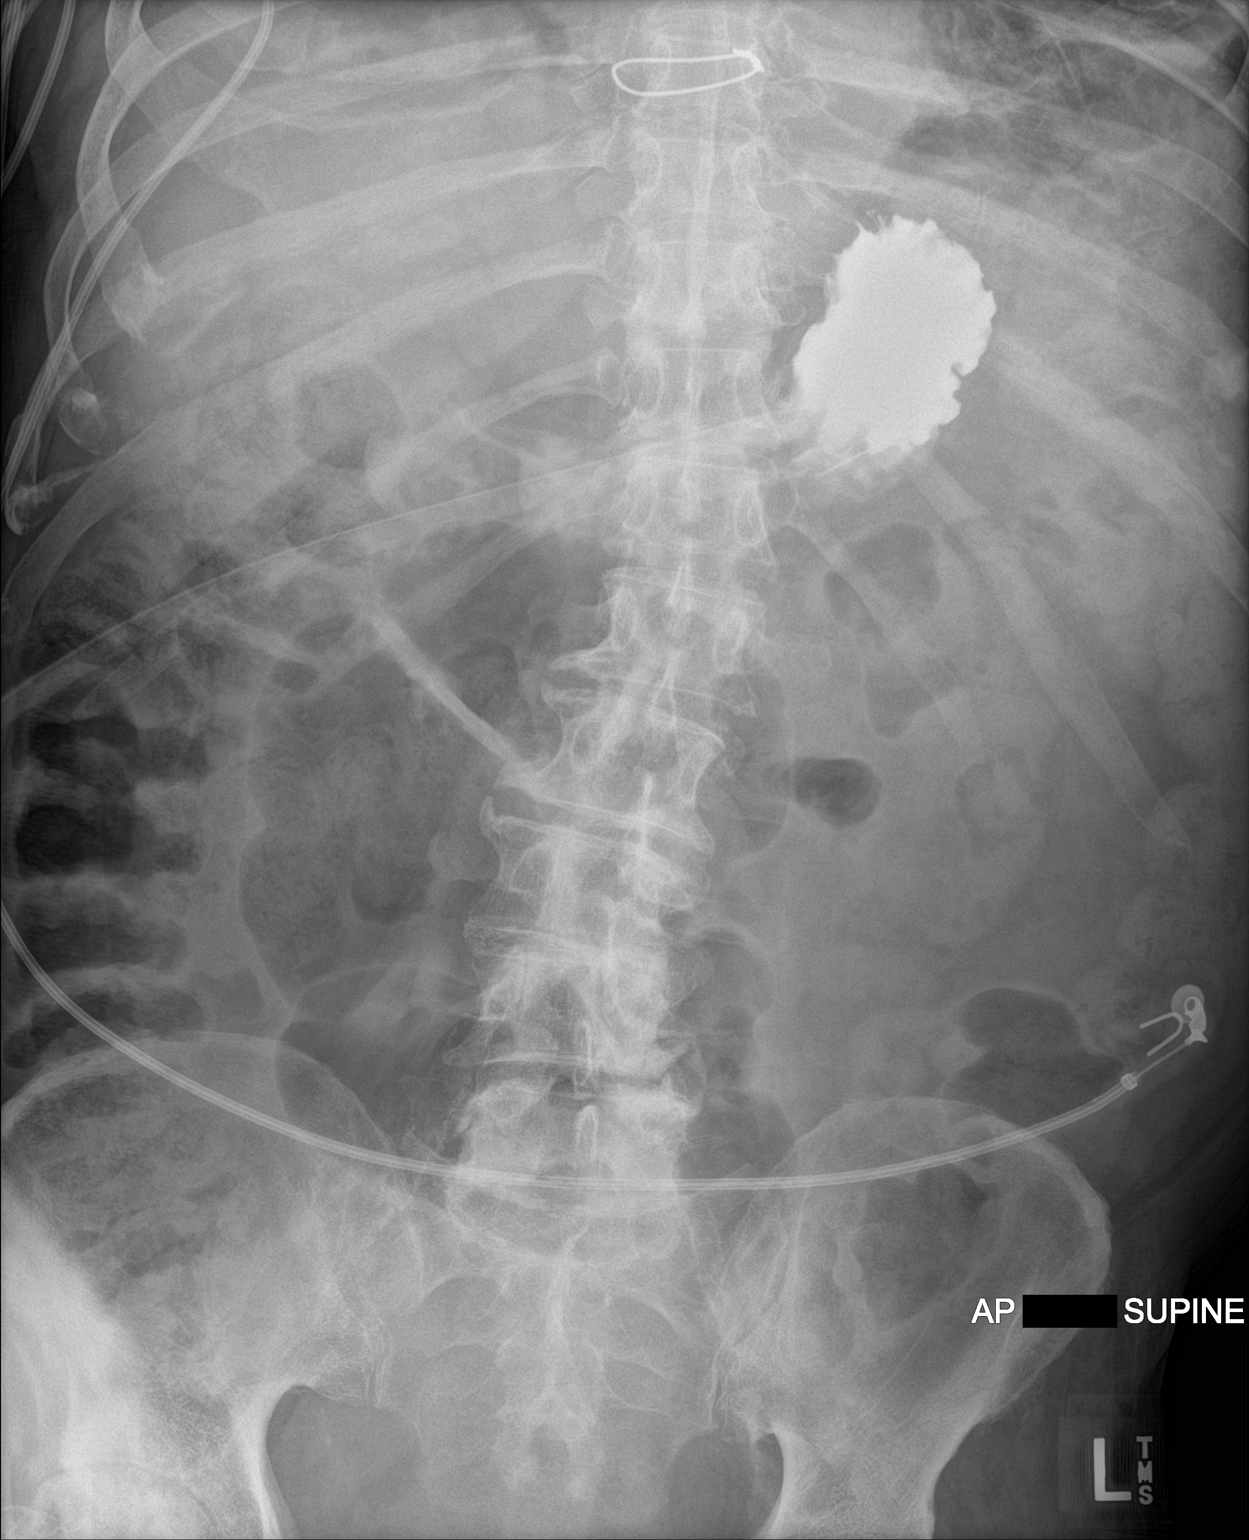

[1 of 1 positions shown; findings below may reference images not displayed]

FINDINGS: Injected contrast opacifies the stomach, consistent with
intraluminal positioning of the gastrostomy tube. A moderate amount
of stool is present in the colon. There is mild gaseous distension
of the sigmoid colon. No dilated loops of bowel are seen to suggest
obstruction. Scoliosis and disc degeneration are noted in the lumbar
spine.
IMPRESSION: Intraluminal positioning of the gastrostomy tube based on injected
contrast.
